# Patient Record
Sex: Female | Born: 1995 | Race: White | Hispanic: No | Marital: Married | Smoking: Current every day smoker
Health system: Southern US, Community
[De-identification: ages and names within clinical notes are randomized; demographics above are authoritative.]

---

## 2019-01-03 ENCOUNTER — Emergency Department (HOSPITAL_COMMUNITY)
Admission: EM | Admit: 2019-01-03 | Discharge: 2019-01-03 | Disposition: A | Discharge status: Home / Self Care | Payer: Self-pay | Attending: Emergency Medicine | Admitting: Emergency Medicine

## 2019-01-03 ENCOUNTER — Other Ambulatory Visit: Payer: Self-pay

## 2019-01-03 ENCOUNTER — Emergency Department (HOSPITAL_COMMUNITY): Payer: Self-pay

## 2019-01-03 DIAGNOSIS — R740 Nonspecific elevation of levels of transaminase and lactic acid dehydrogenase [LDH]: Secondary | ICD-10-CM | POA: Insufficient documentation

## 2019-01-03 DIAGNOSIS — B182 Chronic viral hepatitis C: Secondary | ICD-10-CM | POA: Insufficient documentation

## 2019-01-03 DIAGNOSIS — N739 Female pelvic inflammatory disease, unspecified: Secondary | ICD-10-CM | POA: Insufficient documentation

## 2019-01-03 DIAGNOSIS — N730 Acute parametritis and pelvic cellulitis: Secondary | ICD-10-CM

## 2019-01-03 DIAGNOSIS — R1033 Periumbilical pain: Secondary | ICD-10-CM | POA: Insufficient documentation

## 2019-01-03 DIAGNOSIS — R7401 Elevation of levels of liver transaminase levels: Secondary | ICD-10-CM

## 2019-01-03 DIAGNOSIS — R630 Anorexia: Secondary | ICD-10-CM | POA: Insufficient documentation

## 2019-01-03 DIAGNOSIS — R197 Diarrhea, unspecified: Secondary | ICD-10-CM | POA: Insufficient documentation

## 2019-01-03 LAB — COMPREHENSIVE METABOLIC PANEL
ALT: 275 U/L — ABNORMAL HIGH (ref 0–44)
AST: 188 U/L — ABNORMAL HIGH (ref 15–41)
Albumin: 4 g/dL (ref 3.5–5.0)
Alkaline Phosphatase: 61 U/L (ref 38–126)
Anion gap: 8 (ref 5–15)
BUN: 6 mg/dL (ref 6–20)
CO2: 23 mmol/L (ref 22–32)
Calcium: 9.3 mg/dL (ref 8.9–10.3)
Chloride: 111 mmol/L (ref 98–111)
Creatinine, Ser: 0.69 mg/dL (ref 0.44–1.00)
GFR calc Af Amer: >60 mL/min (ref >60)
GFR calc non Af Amer: >60 mL/min (ref >60)
Glucose, Bld: 109 mg/dL — ABNORMAL HIGH (ref 70–99)
Potassium: 4.2 mmol/L (ref 3.5–5.1)
Sodium: 142 mmol/L (ref 135–145)
Total Bilirubin: 0.5 mg/dL (ref 0.3–1.2)
Total Protein: 8.1 g/dL (ref 6.5–8.1)

## 2019-01-03 LAB — WET PREP, GENITAL
Sperm: NONE SEEN
Trich, Wet Prep: NONE SEEN
Yeast Wet Prep HPF POC: NONE SEEN

## 2019-01-03 LAB — URINALYSIS, ROUTINE W REFLEX MICROSCOPIC
Bilirubin Urine: NEGATIVE
Glucose, UA: NEGATIVE mg/dL
Ketones, ur: NEGATIVE mg/dL
Leukocytes,Ua: NEGATIVE
Nitrite: NEGATIVE
Protein, ur: NEGATIVE mg/dL
Specific Gravity, Urine: 1.006 (ref 1.005–1.030)
pH: 6 (ref 5.0–8.0)

## 2019-01-03 LAB — CBC
HCT: 43.1 % (ref 36.0–46.0)
Hemoglobin: 13.8 g/dL (ref 12.0–15.0)
MCH: 29.4 pg (ref 26.0–34.0)
MCHC: 32 g/dL (ref 30.0–36.0)
MCV: 91.7 fL (ref 80.0–100.0)
Platelets: 366 10*3/uL (ref 150–400)
RBC: 4.7 MIL/uL (ref 3.87–5.11)
RDW: 13.5 % (ref 11.5–15.5)
WBC: 8.1 10*3/uL (ref 4.0–10.5)
nRBC: 0 % (ref 0.0–0.2)

## 2019-01-03 LAB — I-STAT BETA HCG BLOOD, ED (MC, WL, AP ONLY): I-stat hCG, quantitative: <5 m[IU]/mL (ref <5)

## 2019-01-03 LAB — LIPASE, BLOOD: Lipase: 45 U/L (ref 11–51)

## 2019-01-03 MED ORDER — DOXYCYCLINE HYCLATE 100 MG PO TABS
100.0000 mg | ORAL_TABLET | Freq: Once | ORAL | Status: AC
  Administered 2019-01-03: 21:00:00 100 mg via ORAL
  Filled 2019-01-03: qty 1

## 2019-01-03 MED ORDER — LIDOCAINE HCL (PF) 1 % IJ SOLN
5.0000 mL | Freq: Once | INTRAMUSCULAR | Status: AC
  Administered 2019-01-03: 5 mL
  Filled 2019-01-03: qty 5

## 2019-01-03 MED ORDER — CEFTRIAXONE SODIUM 250 MG IJ SOLR
250.0000 mg | Freq: Once | INTRAMUSCULAR | Status: AC
  Administered 2019-01-03: 250 mg via INTRAMUSCULAR
  Filled 2019-01-03: qty 250

## 2019-01-03 MED ORDER — KETOROLAC TROMETHAMINE 15 MG/ML IJ SOLN
15.0000 mg | Freq: Once | INTRAMUSCULAR | Status: DC
  Filled 2019-01-03: qty 1

## 2019-01-03 MED ORDER — DOXYCYCLINE HYCLATE 100 MG PO CAPS: 100.0000 mg | ORAL_CAPSULE | Freq: Two times a day (BID) | ORAL | 0 refills | Status: AC

## 2019-01-03 MED ORDER — ONDANSETRON HCL 4 MG/2ML IJ SOLN
4.0000 mg | Freq: Once | INTRAMUSCULAR | Status: DC
  Filled 2019-01-03: qty 2

## 2019-01-03 MED ORDER — SODIUM CHLORIDE 0.9 % IV BOLUS: 1000.0000 mL | Freq: Once | INTRAVENOUS | Status: DC

## 2019-01-03 MED ORDER — METRONIDAZOLE 500 MG PO TABS: 500.0000 mg | ORAL_TABLET | Freq: Two times a day (BID) | ORAL | 0 refills | Status: AC

## 2019-01-03 MED ORDER — SODIUM CHLORIDE 0.9% FLUSH: 3.0000 mL | Freq: Once | INTRAVENOUS | Status: DC

## 2019-01-03 NOTE — Discharge Instructions (Signed)
Take Tylenol or Ibuprofen for pain Take Flagyl twice a day for one week Take Doxy twice a day for 2 weeks Try a heating pad on the lower abdomen for pain Please return if worsening

## 2019-01-03 NOTE — ED Notes (Signed)
Pt in ultrasound at this time

## 2019-01-03 NOTE — ED Provider Notes (Signed)
[facility] EMERGENCY DEPARTMENT Provider Note   CSN: [id number] Arrival date & time: 01/03/19  1512     History   Chief Complaint Chief Complaint  Patient presents with   Nausea   Generalized Body Aches    HPI Desiree White is a 23 y.o. female who presents with nausea and abdominal pain.  Past medical history significant for hepatitis C.  She states she hasn't felt well for about a week. She reports lower abdominal pain, nausea, diarrhea, decreased appetite which is causing her to be dizzy. The pain is worse with eating because it puts more pressure on her abdomen. She reports the diarrhea is green in color. She denies urinary or vaginal symptoms. LMP was July 3. She is originally from [place] and moved to [place] recently. She denies fever, CP/SOB, cough. 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 Last time she had intercourse was in February.   HPI  No past medical history on file.  There are no active problems to display for this patient.   The histories are not reviewed yet. Please review them in the "History" navigator section and refresh this [place].   OB History   No obstetric history on file.      Home Medications    Prior to Admission medications   Not on File    Family History No family history on file.  Social History Social History   Tobacco Use   Smoking status: Not on file  Substance Use Topics   Alcohol use: Not on file   Drug use: Not on file     Allergies   Patient has no allergy information on record.   Review of Systems Review of Systems  Constitutional: Positive for appetite change and chills. Negative for fever.  Respiratory: Negative for cough and shortness of breath.   Cardiovascular: Negative for chest pain.  Gastrointestinal: Positive for abdominal pain, diarrhea and nausea. Negative for vomiting.  Genitourinary: Negative for dysuria.  Neurological: Positive for dizziness. Negative for  headaches.  All other systems reviewed and are negative.    Physical Exam Updated Vital Signs BP 102/74 (BP Location: Right Arm)    Pulse (!) 108    Temp 98.5 F (36.9 C) (Oral)    Resp 16    LMP 12/20/2018 (Exact Date)    SpO2 98%   Physical Exam Vitals signs and nursing note reviewed.  Constitutional:      General: She is not in acute distress.    Appearance: Normal appearance. She is well-developed. She is not ill-appearing.     Comments: Calm, cooperative. Uncomfortable appearing  HENT:     Head: Normocephalic and atraumatic.  Eyes:     General: No scleral icterus.       Right eye: No discharge.        Left eye: No discharge.     Conjunctiva/sclera: Conjunctivae normal.     Pupils: Pupils are equal, round, and reactive to light.  Neck:     Musculoskeletal: Normal range of motion.  Cardiovascular:     Rate and Rhythm: Normal rate and regular rhythm.  Pulmonary:     Effort: Pulmonary effort is normal. No respiratory distress.     Breath sounds: Normal breath sounds.  Abdominal:     General: There is no distension.     Palpations: Abdomen is soft.     Tenderness: There is abdominal tenderness (suprapubic).  Genitourinary:    Comments: Pelvic: No  inguinal lymphadenopathy or inguinal hernia noted. Normal external genitalia. Significant pain with speculum insertion. Cervix was not visualized due to patient's pain. There was white discharge in the vaginal vault. On bimanual examination no adnexal tenderness but there is +CMT. Chaperone Revonda Standard([place], [place]) present during exam.   Skin:    General: Skin is warm and dry.  Neurological:     Mental Status: She is alert and oriented to person, place, and time.  Psychiatric:        Behavior: Behavior normal.      ED Treatments / Results  Labs (all labs ordered are listed, but only abnormal results are displayed) Labs Reviewed  WET PREP, GENITAL - Abnormal; Notable for the following components:      Result Value   Clue Cells Wet  Prep HPF POC PRESENT (*)    WBC, Wet Prep HPF POC MANY (*)    All other components within normal limits  COMPREHENSIVE METABOLIC PANEL - Abnormal; Notable for the following components:   Glucose, Bld 109 (*)    AST 188 (*)    ALT 275 (*)    All other components within normal limits  URINALYSIS, ROUTINE W REFLEX MICROSCOPIC - Abnormal; Notable for the following components:   Hgb urine dipstick SMALL (*)    Bacteria, UA RARE (*)    All other components within normal limits  LIPASE, BLOOD  CBC  RPR  HIV ANTIBODY (ROUTINE TESTING W REFLEX)  I-STAT BETA HCG BLOOD, ED ([facility], [facility], [facility] ONLY)  GC/CHLAMYDIA PROBE AMP ([place]) NOT AT [facility]    EKG None  Radiology [place] Transvaginal Non-ob  Result Date: 01/03/2019 CLINICAL DATA:  Pelvic pain EXAM: TRANSABDOMINAL AND TRANSVAGINAL ULTRASOUND OF PELVIS DOPPLER ULTRASOUND OF OVARIES TECHNIQUE: Both transabdominal and transvaginal ultrasound examinations of the pelvis were performed. Transabdominal technique was performed for global imaging of the pelvis including uterus, ovaries, adnexal regions, and pelvic cul-de-sac. It was necessary to proceed with endovaginal exam following the transabdominal exam to visualize the ovaries. Color and duplex Doppler ultrasound was utilized to evaluate blood flow to the ovaries. COMPARISON:  None. FINDINGS: Uterus Measurements: 7.0 x 2.7 x 3.4 cm = volume: 33 mL. No fibroids or other mass visualized. Endometrium Thickness: 4 mm in thickness.  No focal abnormality visualized. Right ovary Measurements: 2.4 x 2.2 x 2.1 cm = volume: 5.7 mL. Normal appearance/no adnexal mass. Left ovary Measurements: 2.8 x 2.2 x 2.4 cm = volume: 7.4 mL. Normal appearance/no adnexal mass. Pulsed Doppler evaluation of both ovaries demonstrates normal low-resistance arterial and venous waveforms. Other findings No abnormal free fluid. IMPRESSION: Normal study. Electronically Signed   By: Charlett NoseKevin  Dover M.D.   On: 01/03/2019 20:16   [place] Pelvis  Complete  Result Date: 01/03/2019 CLINICAL DATA:  Pelvic pain EXAM: TRANSABDOMINAL AND TRANSVAGINAL ULTRASOUND OF PELVIS DOPPLER ULTRASOUND OF OVARIES TECHNIQUE: Both transabdominal and transvaginal ultrasound examinations of the pelvis were performed. Transabdominal technique was performed for global imaging of the pelvis including uterus, ovaries, adnexal regions, and pelvic cul-de-sac. It was necessary to proceed with endovaginal exam following the transabdominal exam to visualize the ovaries. Color and duplex Doppler ultrasound was utilized to evaluate blood flow to the ovaries. COMPARISON:  None. FINDINGS: Uterus Measurements: 7.0 x 2.7 x 3.4 cm = volume: 33 mL. No fibroids or other mass visualized. Endometrium Thickness: 4 mm in thickness.  No focal abnormality visualized. Right ovary Measurements: 2.4 x 2.2 x 2.1 cm = volume: 5.7 mL. Normal appearance/no adnexal mass. Left ovary Measurements: 2.8  x 2.2 x 2.4 cm = volume: 7.4 mL. Normal appearance/no adnexal mass. Pulsed Doppler evaluation of both ovaries demonstrates normal low-resistance arterial and venous waveforms. Other findings No abnormal free fluid. IMPRESSION: Normal study. Electronically Signed   By: Charlett NoseKevin  Dover M.D.   On: 01/03/2019 20:16   [place] Art/ven Flow Abd Pelv Doppler  Result Date: 01/03/2019 CLINICAL DATA:  Pelvic pain EXAM: TRANSABDOMINAL AND TRANSVAGINAL ULTRASOUND OF PELVIS DOPPLER ULTRASOUND OF OVARIES TECHNIQUE: Both transabdominal and transvaginal ultrasound examinations of the pelvis were performed. Transabdominal technique was performed for global imaging of the pelvis including uterus, ovaries, adnexal regions, and pelvic cul-de-sac. It was necessary to proceed with endovaginal exam following the transabdominal exam to visualize the ovaries. Color and duplex Doppler ultrasound was utilized to evaluate blood flow to the ovaries. COMPARISON:  None. FINDINGS: Uterus Measurements: 7.0 x 2.7 x 3.4 cm = volume: 33 mL. No fibroids or  other mass visualized. Endometrium Thickness: 4 mm in thickness.  No focal abnormality visualized. Right ovary Measurements: 2.4 x 2.2 x 2.1 cm = volume: 5.7 mL. Normal appearance/no adnexal mass. Left ovary Measurements: 2.8 x 2.2 x 2.4 cm = volume: 7.4 mL. Normal appearance/no adnexal mass. Pulsed Doppler evaluation of both ovaries demonstrates normal low-resistance arterial and venous waveforms. Other findings No abnormal free fluid. IMPRESSION: Normal study. Electronically Signed   By: Charlett NoseKevin  Dover M.D.   On: 01/03/2019 20:16    Procedures Procedures (including critical care time)  Medications Ordered in ED Medications  sodium chloride flush (NS) 0.9 % injection 3 mL (has no administration in time range)  cefTRIAXone (ROCEPHIN) injection 250 mg (has no administration in time range)  doxycycline (VIBRA-TABS) tablet 100 mg (has no administration in time range)  lidocaine (PF) (XYLOCAINE) 1 % injection 5 mL (has no administration in time range)     Initial Impression / Assessment and Plan / ED Course  I have reviewed the triage vital signs and the nursing notes.  Pertinent labs & imaging results that were available during my care of the patient were reviewed by me and considered in my medical decision making (see chart for details).  23 year old female presents with abdominal pain, nausea and diarrhea for about a week.  She is tachycardic in triage but otherwise vital signs are normal.  On exam she is calm and uncomfortable appearing due to pain.  Abdomen is soft and she is tender over the suprapubic area.  A pelvic exam was performed which revealed significant cervical motion tenderness with a white discharge.  CBC is normal.  CMP is remarkable for elevated AST and ALT.  The patient does have a history of hepatitis C I have no value to compare this to.  Her urine appears normal.  Wet prep is remarkable for clue cells and many white blood cells.  She is not pregnant.  Pelvic ultrasound was  obtained which was normal.  IV fluids, Zofran, Toradol was ordered however the patient informed me that she needs to leave because her ride is here.  She was given treatment for PID with IM Rocephin, doxy and was given prescription for Flagyl.  I do not think she has any gallbladder pathology or upper abdominal pathology at this time with no upper abdominal tenderness and normal bilirubin and alkaline phophatase.  She was given prescription for Doxy and Flagyl and strict return precautions.  Final Clinical Impressions(s) / ED Diagnoses   Final diagnoses:  PID (acute pelvic inflammatory disease)  Transaminitis    ED Discharge Orders  None       Beryle QuantGekas, Tryniti Laatsch Marie, PA-C 01/03/19 2044    Bethann BerkshireZammit, Joseph, MD 01/04/19 [phone]

## 2019-01-03 NOTE — ED Triage Notes (Signed)
Per pt she has been having nausea,generalized body aches, chills, for about 1 week. Pt having abdominal pain in the lower quadrant. No fevers.

## 2019-01-04 LAB — RPR: RPR Ser Ql: NONREACTIVE

## 2019-01-04 LAB — HIV ANTIBODY (ROUTINE TESTING W REFLEX): HIV Screen 4th Generation wRfx: NONREACTIVE

## 2019-01-08 LAB — GC/CHLAMYDIA PROBE AMP (~~LOC~~) NOT AT ARMC
Chlamydia: NEGATIVE
Neisseria Gonorrhea: NEGATIVE

## 2019-03-14 ENCOUNTER — Emergency Department (HOSPITAL_COMMUNITY): Payer: Self-pay

## 2019-03-14 ENCOUNTER — Encounter (HOSPITAL_COMMUNITY): Payer: Self-pay | Admitting: *Deleted

## 2019-03-14 ENCOUNTER — Other Ambulatory Visit: Payer: Self-pay

## 2019-03-14 ENCOUNTER — Emergency Department (HOSPITAL_COMMUNITY)
Admission: EM | Admit: 2019-03-14 | Discharge: 2019-03-14 | Disposition: A | Discharge status: Home / Self Care | Payer: Self-pay | Attending: Emergency Medicine | Admitting: Emergency Medicine

## 2019-03-14 DIAGNOSIS — Z20828 Contact with and (suspected) exposure to other viral communicable diseases: Secondary | ICD-10-CM | POA: Insufficient documentation

## 2019-03-14 DIAGNOSIS — F17210 Nicotine dependence, cigarettes, uncomplicated: Secondary | ICD-10-CM | POA: Insufficient documentation

## 2019-03-14 DIAGNOSIS — R062 Wheezing: Secondary | ICD-10-CM

## 2019-03-14 DIAGNOSIS — J4531 Mild persistent asthma with (acute) exacerbation: Secondary | ICD-10-CM | POA: Insufficient documentation

## 2019-03-14 MED ORDER — MAGNESIUM SULFATE 2 GM/50ML IV SOLN
2.0000 g | Freq: Once | INTRAVENOUS | Status: AC
  Administered 2019-03-14: 2 g via INTRAVENOUS
  Filled 2019-03-14: qty 50

## 2019-03-14 MED ORDER — PREDNISONE 20 MG PO TABS: 40.0000 mg | ORAL_TABLET | Freq: Every day | ORAL | 0 refills | Status: AC

## 2019-03-14 MED ORDER — PREDNISONE 50 MG PO TABS
60.0000 mg | ORAL_TABLET | Freq: Once | ORAL | Status: AC
  Administered 2019-03-14: 60 mg via ORAL
  Filled 2019-03-14: qty 1

## 2019-03-14 MED ORDER — ALBUTEROL SULFATE HFA 108 (90 BASE) MCG/ACT IN AERS
4.0000 | INHALATION_SPRAY | Freq: Once | RESPIRATORY_TRACT | Status: AC
  Administered 2019-03-14: 17:00:00 4 via RESPIRATORY_TRACT
  Filled 2019-03-14: qty 6.7

## 2019-03-14 NOTE — ED Provider Notes (Signed)
[facility] EMERGENCY DEPARTMENT Provider Note   CSN: [id number] Arrival date & time: 03/14/19  1533     History   Chief Complaint Chief Complaint  Patient presents with  . Cough    HPI Desiree White is a 23 y.o. female.     23 y.o female with a PMH of asthma presents to the ED with a chief complaint of wheezing x 1 week.  Patient endorses a dry cough for the past week, states sometimes this can be productive with some green sputum.  She also endorses some shortness of breath, this is worse when she has coughing fits.  Patient also has rhinorrhea, reports some green mild discharge from her nose.  She is currently employed at [employer].  She does have a prior history of asthma, does not have any inhaler at home.  Patient currently smokes about a pack a day, reports she has been smoking less.  She denies any fevers, chills, nausea, chest pain, sick contacts. No prior hospitalizations for asthma exacerbations per patient's records.  The history is provided by the patient.  Cough Associated symptoms: shortness of breath and wheezing   Associated symptoms: no fever     History reviewed. No pertinent past medical history.  There are no active problems to display for this patient.   History reviewed. No pertinent surgical history.   OB History   No obstetric history on file.      Home Medications    Prior to Admission medications   Medication Sig Start Date End Date Taking? Authorizing Provider  doxycycline (VIBRAMYCIN) 100 MG capsule Take 1 capsule (100 mg total) by mouth 2 (two) times daily. 01/03/19   Recardo Evangelist, PA-C  metroNIDAZOLE (FLAGYL) 500 MG tablet Take 1 tablet (500 mg total) by mouth 2 (two) times daily. 01/03/19   Recardo Evangelist, PA-C    Family History No family history on file.  Social History Social History   Tobacco Use  . Smoking status: Current Every Day Smoker    Packs/day: 1.00    Types: Cigarettes  . Smokeless tobacco: Never Used   Substance Use Topics  . Alcohol use: Never    Frequency: Never  . Drug use: Never     Allergies   Penicillins   Review of Systems Review of Systems  Constitutional: Negative for fever.  Respiratory: Positive for cough, shortness of breath and wheezing.   Gastrointestinal: Negative for nausea.     Physical Exam Updated Vital Signs BP 100/65 (BP Location: Right Arm)   Pulse 85   Temp 98.3 F (36.8 C) (Oral)   Resp 16   Ht 5\' 2"  (1.575 m)   Wt 56.7 kg   LMP 02/16/2019   SpO2 98%   BMI 22.86 kg/m   Physical Exam Vitals signs and nursing note reviewed.  Constitutional:      General: She is not in acute distress.    Appearance: She is well-developed.  HENT:     Head: Normocephalic and atraumatic.     Mouth/Throat:     Pharynx: No oropharyngeal exudate.  Eyes:     Pupils: Pupils are equal, round, and reactive to light.  Neck:     Musculoskeletal: Normal range of motion.  Cardiovascular:     Rate and Rhythm: Regular rhythm.     Heart sounds: Normal heart sounds.  Pulmonary:     Effort: Pulmonary effort is normal. Tachypnea present. No respiratory distress.     Breath sounds: Decreased air movement  present. Examination of the right-upper field reveals wheezing. Examination of the left-upper field reveals wheezing. Examination of the right-middle field reveals wheezing. Examination of the right-lower field reveals wheezing. Examination of the left-lower field reveals wheezing. Wheezing present.     Comments: Significant expiratory and inspiratory wheezing throughout.  Decrease in air movement.  Tachypnea present. Abdominal:     General: Bowel sounds are normal. There is no distension.     Palpations: Abdomen is soft.     Tenderness: There is no abdominal tenderness.  Musculoskeletal:        General: No tenderness or deformity.     Right lower leg: No edema.     Left lower leg: No edema.  Skin:    General: Skin is warm and dry.  Neurological:     Mental Status:  She is alert and oriented to person, place, and time.      ED Treatments / Results  Labs (all labs ordered are listed, but only abnormal results are displayed) Labs Reviewed - No data to display  EKG None  Radiology No results found.  Procedures Procedures (including critical care time)  Medications Ordered in ED Medications  albuterol (VENTOLIN HFA) 108 (90 Base) MCG/ACT inhaler 4 puff (has no administration in time range)  predniSONE (DELTASONE) tablet 60 mg (has no administration in time range)  magnesium sulfate IVPB 2 g 50 mL (has no administration in time range)     Initial Impression / Assessment and Plan / ED Course  I have reviewed the triage vital signs and the nursing notes.  Pertinent labs & imaging results that were available during my care of the patient were reviewed by me and considered in my medical decision making (see chart for details).       Patient with a past medical history of asthma presents to the ED with complaints of cough, mild shortness of breath.  Patient does report she has not been using her inhaler, she is currently a daily smoker.  Has not been running any fevers, is nontoxic-appearing although has significant wheezing throughout all lung fields.  No tachypnea present on my examination.  Inspiratory and expiratory wheezing worse on the lower lung fields.  She is currently employed at [employer], will obtain Covid 19 testing, chest x-ray, provide patient with albuterol inhaler which she has not been using in the past several months.  She will also receive IV magnesium.  Patient received 2 g of magnesium sulfate to help with her wheezing.  She also received 4 puffs of albuterol while in the ED, wheezing has significantly improved, does have some residual wheezing to the right upper lobe.  X-ray showed no consolidation, pneumothorax, pleural effusion.  I did obtain a COVID-19 send out swab for patient as she does have a previous history of asthma.   She denies ever being hospitalized for any asthma exacerbation.  7:00 PM after reevaluation patient reports she feels better, will send her home with an albuterol inhaler along with a steroid burst to help decrease some of the swelling in her airways.  She is advised to return to the ED if her symptoms worsen or she experiences any fevers.  Patient was also counseled on smoking cessation as she currently smokes a pack a day.  Patient understands and agrees with management at this time, with stable vital signs, no hypoxia or tachycardia patient stable for discharge.   Portions of this note were generated with Scientist, clinical (histocompatibility and immunogenetics)Dragon dictation software. Dictation errors may occur despite best  attempts at proofreading.  Final Clinical Impressions(s) / ED Diagnoses   Final diagnoses:  Wheezing  Mild persistent asthma with exacerbation    ED Discharge Orders    None       Claude Manges, Cordelia Poche 03/14/19 1903    Bethann Berkshire, MD 03/19/19 1027

## 2019-03-14 NOTE — ED Triage Notes (Signed)
Pt c/o sinus congestion and productive cough x 1 week. Denies fever.

## 2019-03-14 NOTE — Discharge Instructions (Addendum)
Your chest x-ray today showed no pneumonia.  A swab for COVID-19 was obtained during your visit, you will be called with these results.  Today you were provided with an albuterol inhaler, please use this for your wheezing.  Steroids have also been prescribed to help with your wheezing, please take 2 tablets daily for the next 5 days.  Please consider smoking cessation as this is worsening your condition.   If you experience any shortness of breath, chest pain, worsening symptoms please return to the emergency department.

## 2019-03-15 LAB — SARS CORONAVIRUS 2 (TAT 6-24 HRS): SARS Coronavirus 2: NEGATIVE

## 2020-06-25 IMAGING — DX DG CHEST 2V
2 series · 2 of 2 positions shown · non-contrast
Comparison: Radiographs September 04, 2018.

CLINICAL DATA: Wheezing.  Productive cough.

EXAM:
CHEST - 2 VIEW

[chest lat]
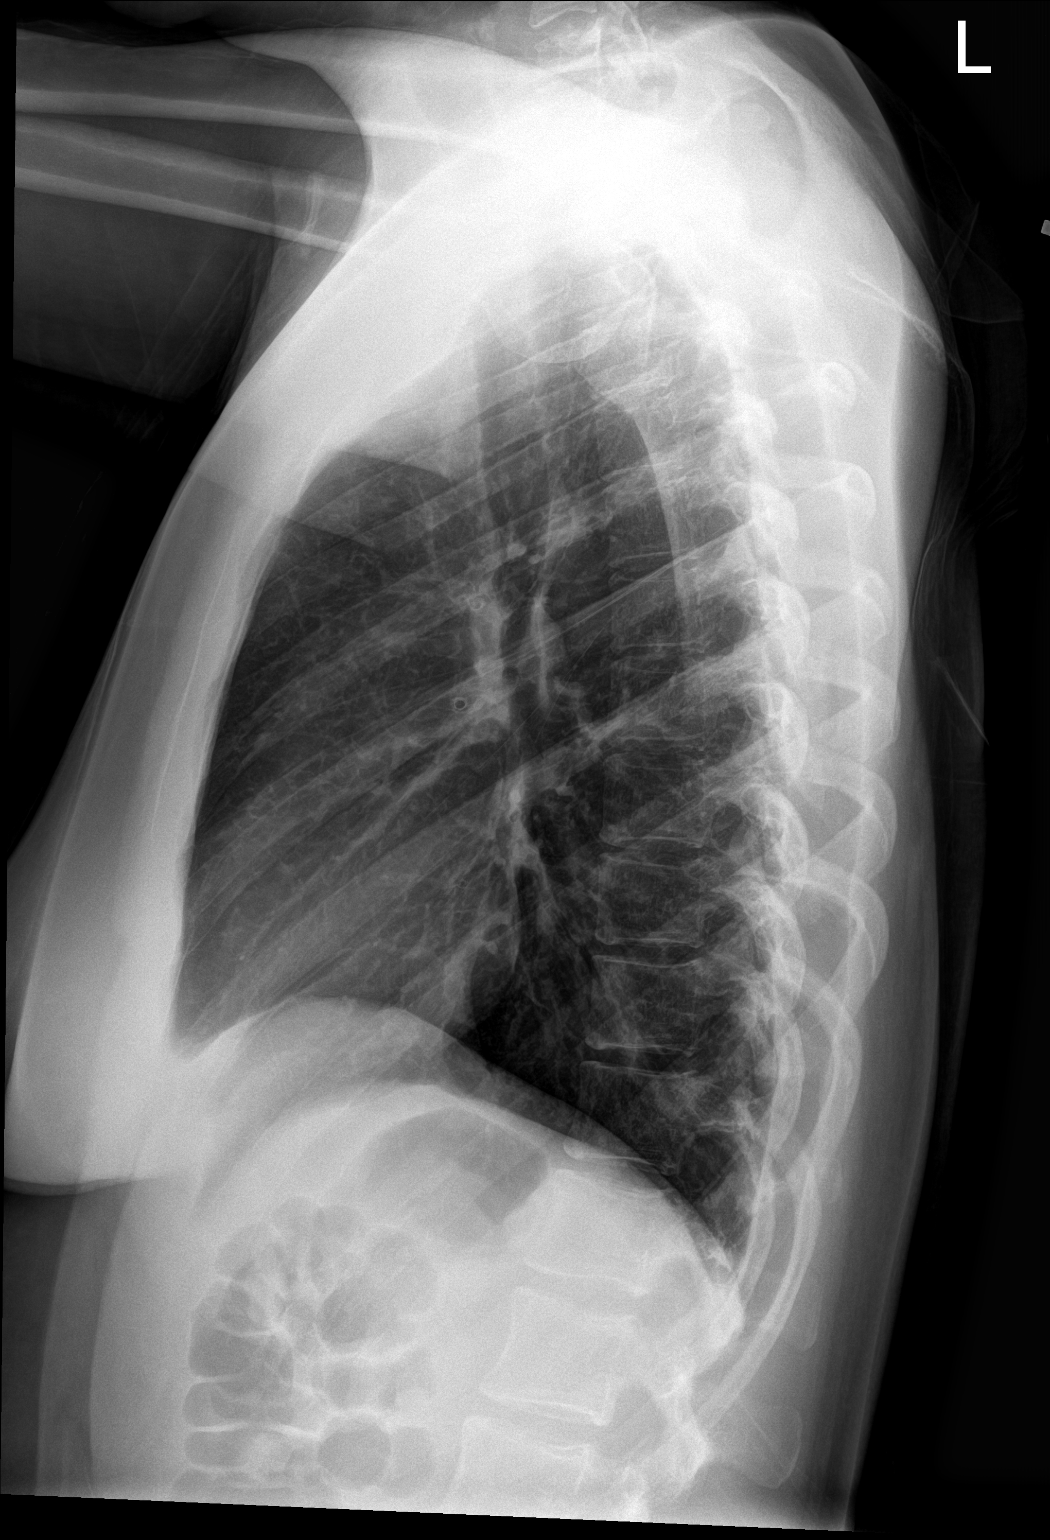

[chest pa]
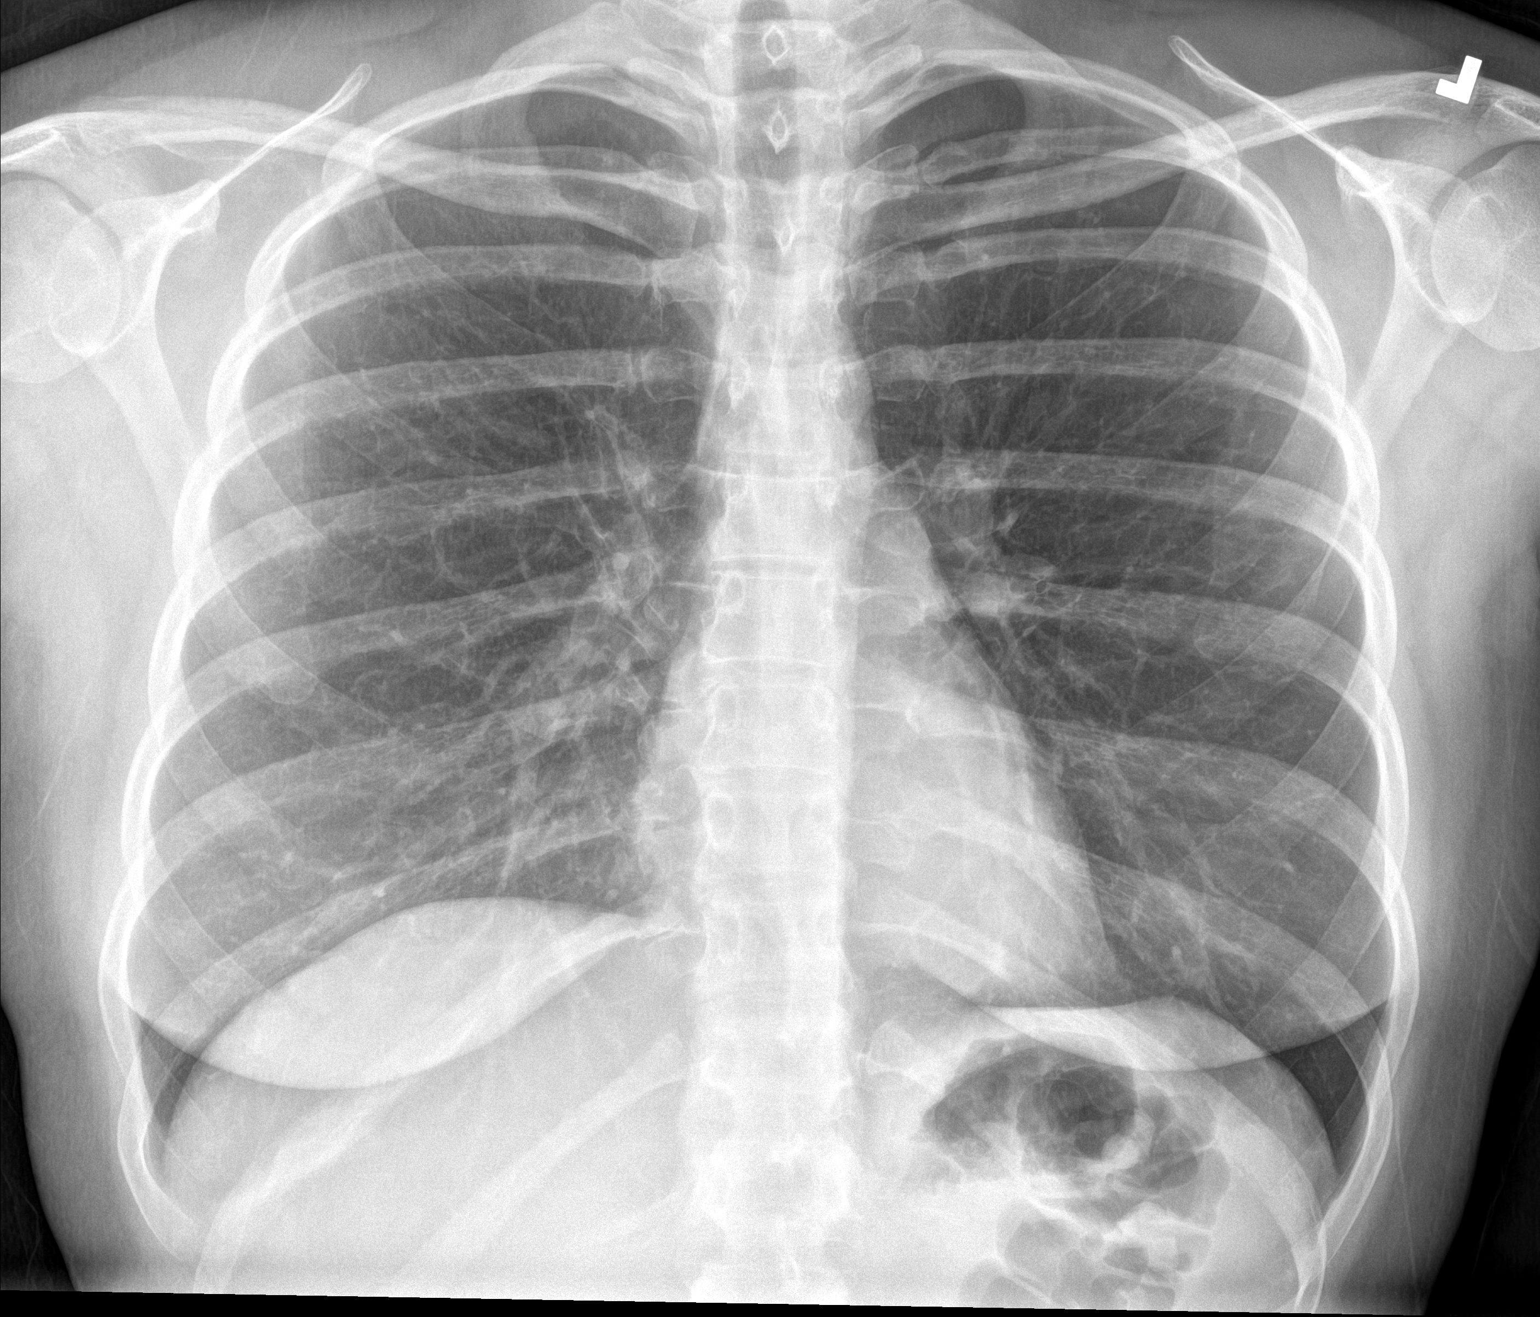

[2 of 2 positions shown; findings below may reference images not displayed]

FINDINGS: The heart size and mediastinal contours are within normal limits.
Both lungs are clear. No pneumothorax or pleural effusion is noted.
The visualized skeletal structures are unremarkable.
IMPRESSION: No active cardiopulmonary disease.
